# Patient Record
Sex: Female | Born: 1935 | Race: White | Hispanic: No | Marital: Married
Health system: Midwestern US, Academic
[De-identification: ages and names within clinical notes are randomized; demographics above are authoritative.]

---

## 2019-01-08 ENCOUNTER — Encounter: Admit: 2019-01-08 | Discharge: 2019-01-08

## 2019-01-08 ENCOUNTER — Ambulatory Visit: Admit: 2019-01-08 | Discharge: 2019-01-09

## 2019-01-08 ENCOUNTER — Ambulatory Visit: Admit: 2019-01-08 | Discharge: 2019-01-08

## 2019-01-08 DIAGNOSIS — R42 Dizziness and giddiness: Secondary | ICD-10-CM

## 2019-01-08 DIAGNOSIS — R0789 Other chest pain: Secondary | ICD-10-CM

## 2019-01-08 DIAGNOSIS — R0600 Dyspnea, unspecified: Secondary | ICD-10-CM

## 2019-01-13 ENCOUNTER — Encounter: Admit: 2019-01-13 | Discharge: 2019-01-13

## 2019-01-13 ENCOUNTER — Ambulatory Visit: Admit: 2019-01-13 | Discharge: 2019-01-14

## 2019-01-13 DIAGNOSIS — Z9189 Other specified personal risk factors, not elsewhere classified: Secondary | ICD-10-CM

## 2019-01-13 DIAGNOSIS — R079 Chest pain, unspecified: Secondary | ICD-10-CM

## 2019-01-13 DIAGNOSIS — I10 Essential (primary) hypertension: Secondary | ICD-10-CM

## 2019-01-13 DIAGNOSIS — R0602 Shortness of breath: Secondary | ICD-10-CM

## 2019-01-13 MED ORDER — LOSARTAN 25 MG PO TAB
25 mg | ORAL_TABLET | Freq: Two times a day (BID) | ORAL | 3 refills | 30.00000 days | Status: AC
Start: 2019-01-13 — End: ?

## 2019-01-13 NOTE — Progress Notes
Date of Service: 01/13/2019    Amber Keith is a 85 y.o. female.       HPI     Patient is an 85 year old white female that has been referred to our practice for chest pain evaluation.  Patient reports that in the past 2 weeks she has been experiencing some episodes of chest pain that were located under the left axillary line radiating towards the epigastric area and sometimes to the back.  They are not associated with physical activity.  At times, patient has been experiencing symptoms of shortness of breath with physical activity.    She reports having pneumonia and flu approximately 2 years ago and patient thinks that ever since a lot of the way she usually feels has changed.  She had permanent changes in her voice, and also noticed more fatigue.    Patient was evaluated with a 2D echo Doppler study on 01/08/2019, it demonstrated normal left ventricular systolic function, there were no significant valvular abnormalities and the pulmonary artery pressure was 25 mmHg.  A myocardial perfusion imaging study performed the same day did not demonstrate ischemia and the left ventricle systolic function was normal at 89/s.    Today in our office patient's blood pressure was elevated, with the recorded 142/96 mmHg in the left arm and 142/94 mmHg in the right arm with a heart rate of 76 bpm.  She is currently on losartan 25 mg p.o. daily.    Cholesterol profile dated 01/08/2019 demonstrated a total cholesterol of 251 mg/dL, calculated HDL 557 mg/dL, HDL 66 mg/dL, and triglycerides 322 mg/dL.  Patient is not currently on statin therapy.    Family history: Not significant due to patient's age    Social history: Patient retired on her last but they on September 18, 2018, she used to work for up [employer].  She does not smoke cigarettes and does not drink alcohol.         Vitals:    01/13/19 1058 01/13/19 1108   BP: (!) 142/96 (!) 142/94   BP Source: Arm, Left Upper Arm, Right Upper   Pulse: 76    SpO2: 97% Weight: 59.3 kg (130 lb 12.8 oz)    Height: 1.651 m (5' 5)    PainSc: Zero      Body mass index is 21.77 kg/m???.     Past Medical History  Patient Active Problem List    Diagnosis Date Noted   ??? Chest pain 01/13/2019     01/08/2019 : MPI  This study is normal and represents low probability of significant inducible myocardial ischemia.  There are no perfusion abnormalities identified on this study.  Regional and global left ventricular function are normal.  All myocardial segments appear viable.  High risk scintigraphic features are absent.       ??? Hypertension 01/13/2019     01/08/19  Echo:  EF 60%.  Normal right ventricular contractility.  Normal central venous pressure.  Mild mitral annular calcification.  Normal mitral valve motion.  Normal tricuspid valve motion.  Mild tricuspid regurgitation.  Est PA pressure 25 mmHg.  Pulomary artery, pulmonic valve, and aortic arch are not well visualized.  Mild pulmonic regurgitation.  Trace pericardial effusion.    06/07/2014 Echo:  There is hyperdynamic LV systolic function.  LV EF 74%.  Mild diastolic dysfunction.  There is reversal of the E to A ratio and/or prolonged deceleration time consistent with impaired LV relaxation.      ??? Shortness of breath 01/13/2019  05/25/2016 PFT: Normal spirometry.  FEV1 2.11 liters or 106% predicted. FEV1/FVC ratio at 76%.  No improvement postbronchodilator.  Normal lung volumes with a total lung cpacity of **% of predicted. Mildly diminished diffusion at 71% of predicted but corrects for lung volumes up to 94% of predicted.  Airway resistance was normal. Flow volume loop shows a normal inspiratory and expiratory limb and time volume curves are satisfactory.            Review of Systems   Constitution: Positive for malaise/fatigue.   HENT: Positive for hearing loss, hoarse voice and tinnitus.    Eyes: Negative.    Cardiovascular: Positive for chest pain, dyspnea on exertion and irregular heartbeat. Respiratory: Positive for shortness of breath.    Endocrine: Negative.    Hematologic/Lymphatic: Negative.    Skin: Negative.    Musculoskeletal: Positive for back pain and muscle weakness.   Gastrointestinal: Negative.    Genitourinary: Negative.    Neurological: Positive for light-headedness and weakness.   Psychiatric/Behavioral: Positive for memory loss.   Allergic/Immunologic: Negative.        Physical Exam  General Appearance: normal in appearance  Skin: warm, moist, no ulcers or xanthomas  Eyes: conjunctivae and lids normal, pupils are equal and round  Lips & Oral Mucosa: no pallor or cyanosis  Neck Veins: neck veins are flat, neck veins are not distended  Chest Inspection: chest is normal in appearance  Respiratory Effort: breathing comfortably, no respiratory distress  Auscultation/Percussion: lungs clear to auscultation, no rales or rhonchi, no wheezing  Cardiac Rhythm: regular rhythm and normal rate  Cardiac Auscultation: S1, S2 normal, no rub, no gallop  Murmurs: no murmur  Carotid Arteries: normal carotid upstroke bilaterally, no bruit  Lower Extremity Edema: no lower extremity edema  Abdominal Exam: soft, non-tender, no masses, bowel sounds normal  Liver & Spleen: no organomegaly  Language and Memory: patient responsive and seems to comprehend information  Neurologic Exam: neurological assessment grossly intact      Cardiovascular Studies  Twelve-lead EKG demonstrated normal sinus rhythm.    Problems Addressed Today  Encounter Diagnoses   Name Primary?   ??? Chest pain, unspecified type    ??? Hypertension, unspecified type    ??? Shortness of breath    ??? At risk for coronary artery disease        Assessment and Plan     In summary: This is an 85 year old female that has been experiencing chest pain, according to her report it appears to be mostly atypical, recent evaluation with an echocardiogram and a regadenoson MPI demonstrated normal left ventricular systolic function, the tomographic pattern was negative for ischemia.    Patient's blood pressure is suboptimally controlled, this could have a contribution to her symptoms of shortness of breath.  She also has hyperlipidemia that is currently not treated with statin therapy.    Plan:    1.  Increase losartan to 25 mg p.o. twice daily  2.  I did asked the patient to start taking aspirin 81 mg p.o. daily  3.  I would also like to recommend to initiate atorvastatin 20 mg p.o. daily, this was not prescribed by me today, it could be initiated by your office.  4.  Follow-up fasting lipid and liver profile in approximately 3 months.         Current Medications (including today's revisions)  ??? fish oil- omega 3-DHA/EPA 300/1,000 mg capsule Take 1 capsule by mouth daily.   ??? losartan (COZAAR) 25 mg tablet  Take 25 mg by mouth daily.   ??? vit C/E/Zn/coppr/lutein/zeaxan (PRESERVISION AREDS-2 PO) Take 1 tablet by mouth daily.

## 2019-01-20 ENCOUNTER — Encounter: Admit: 2019-01-20 | Discharge: 2019-01-20

## 2019-01-20 DIAGNOSIS — R079 Chest pain, unspecified: Secondary | ICD-10-CM

## 2019-01-20 DIAGNOSIS — Z9189 Other specified personal risk factors, not elsewhere classified: Secondary | ICD-10-CM

## 2019-01-20 DIAGNOSIS — E785 Hyperlipidemia, unspecified: Secondary | ICD-10-CM

## 2019-01-22 ENCOUNTER — Encounter: Admit: 2019-01-22 | Discharge: 2019-01-22

## 2019-01-22 MED ORDER — ATORVASTATIN 20 MG PO TAB
20 mg | ORAL_TABLET | Freq: Every day | ORAL | 5 refills | Status: DC
Start: 2019-01-22 — End: 2019-07-20

## 2019-01-22 NOTE — Telephone Encounter
Per transcription [facility] ordered atorvastatin 20 mg daily.  Called pt with recommendations.  Patient verbalizes understanding.  Rx sent to pharmacy.   Pt is to f/u PRN.

## 2019-04-07 ENCOUNTER — Encounter: Admit: 2019-04-07 | Discharge: 2019-04-07 | Payer: MEDICARE

## 2019-04-15 LAB — LIPID PROFILE
Lab: 14
Lab: 151
Lab: 2
Lab: 64
Lab: 70
Lab: 73

## 2019-04-15 LAB — LIVER FUNCTION PANEL
Lab: 0.2
Lab: 0.3

## 2019-04-17 ENCOUNTER — Encounter: Admit: 2019-04-17 | Discharge: 2019-04-17 | Payer: MEDICARE

## 2019-04-17 DIAGNOSIS — R079 Chest pain, unspecified: Secondary | ICD-10-CM

## 2019-04-17 DIAGNOSIS — E785 Hyperlipidemia, unspecified: Secondary | ICD-10-CM

## 2019-04-17 DIAGNOSIS — Z9189 Other specified personal risk factors, not elsewhere classified: Secondary | ICD-10-CM

## 2019-07-18 ENCOUNTER — Encounter: Admit: 2019-07-18 | Discharge: 2019-07-18 | Payer: MEDICARE

## 2019-07-20 ENCOUNTER — Encounter: Admit: 2019-07-20 | Discharge: 2019-07-20 | Payer: MEDICARE

## 2019-07-20 MED ORDER — ATORVASTATIN 20 MG PO TAB
ORAL_TABLET | Freq: Every day | ORAL | 3 refills | 90.00000 days | Status: AC
Start: 2019-07-20 — End: ?

## 2021-08-28 IMAGING — MG MAMMOGRAM 3D SCREEN, BILATERAL
12 of 17 series · 12 of 17 positions shown · non-contrast
Comparison: none

[R CC (1 of 2)]
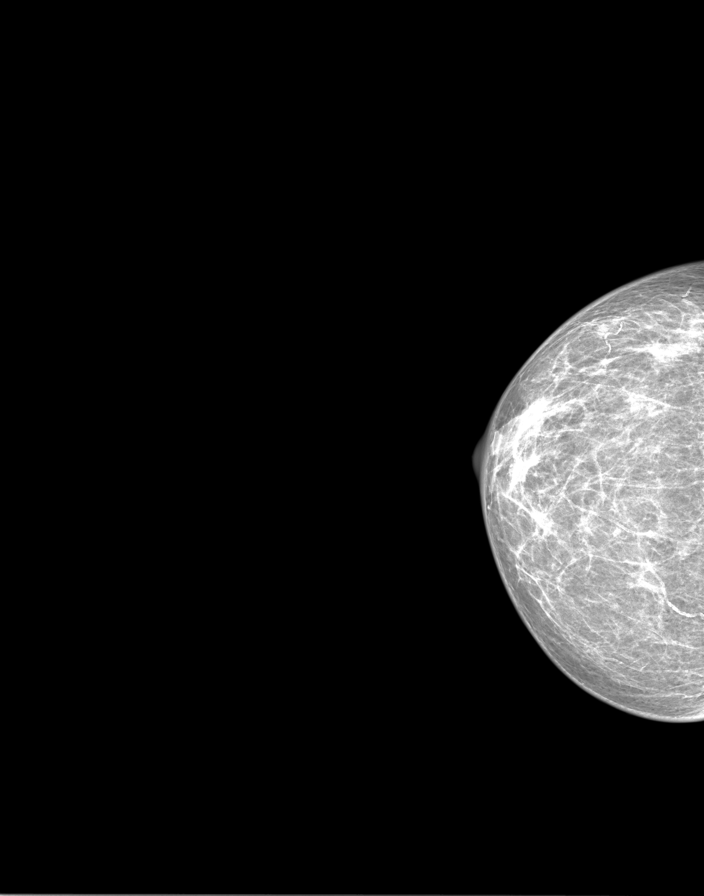

[R tomo (1 of 2)]
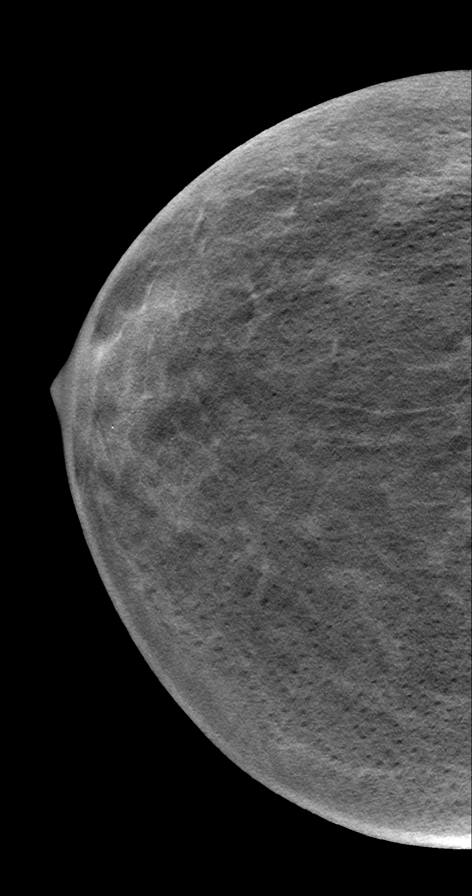

[R CC (2 of 2)]
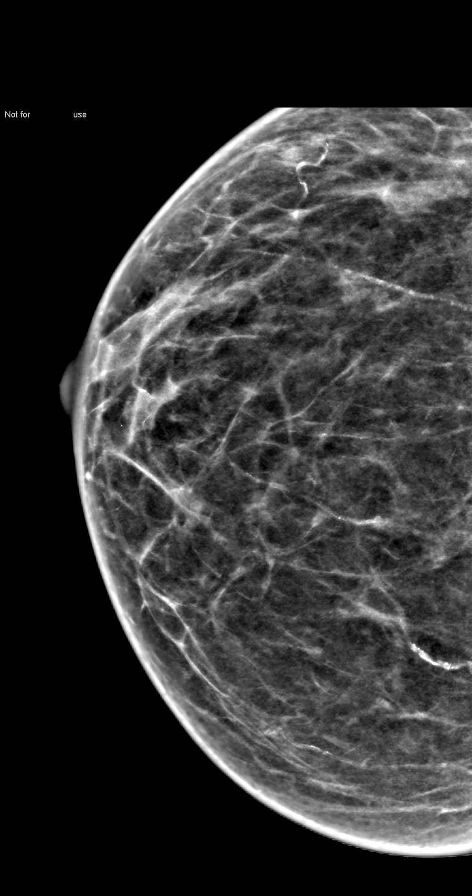

[R (1 of 2)]
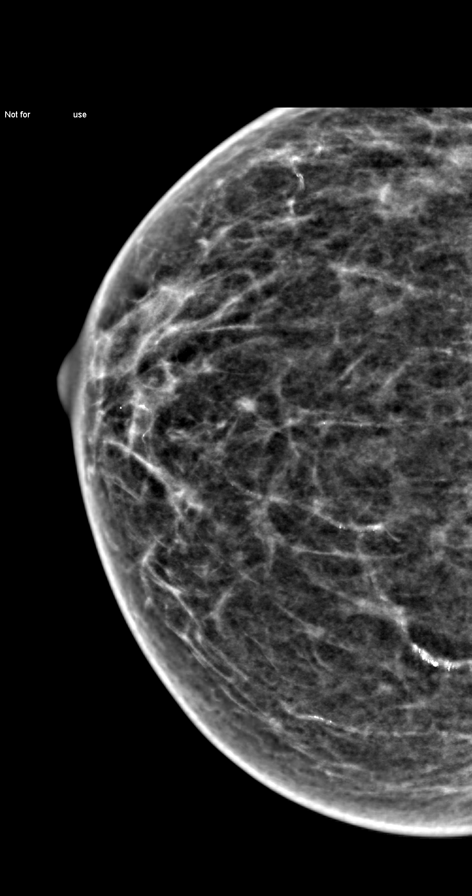

[L CC (1 of 2)]
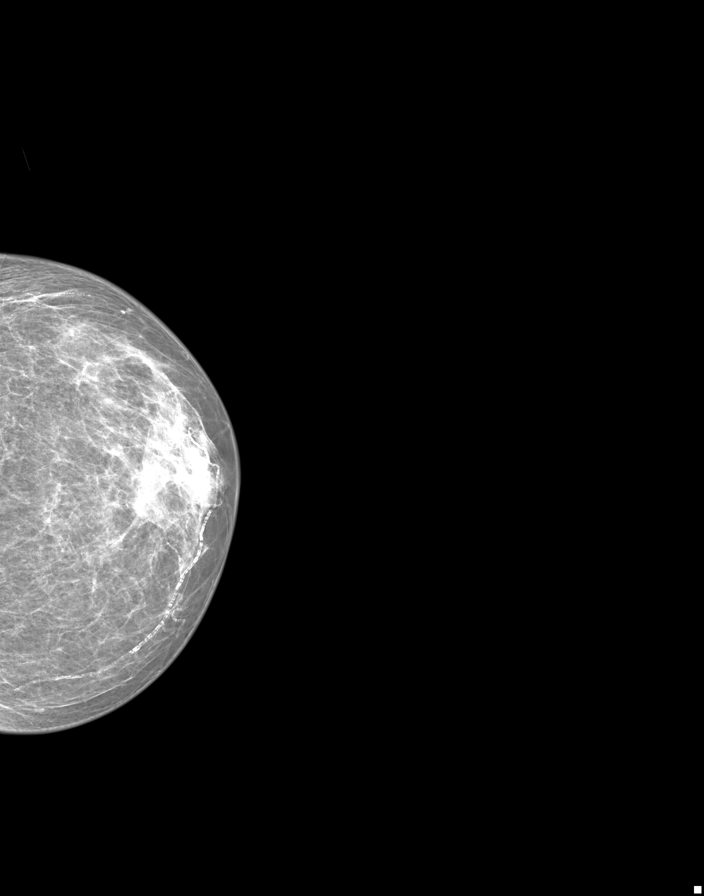

[L tomo]
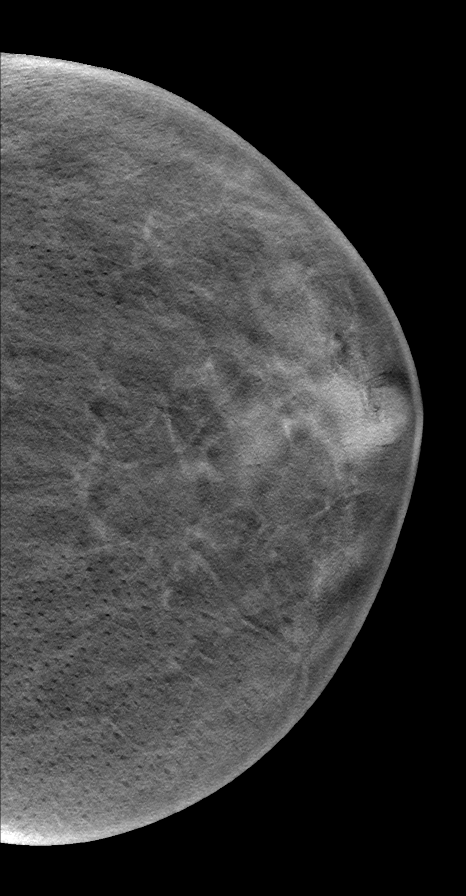

[L CC (2 of 2)]
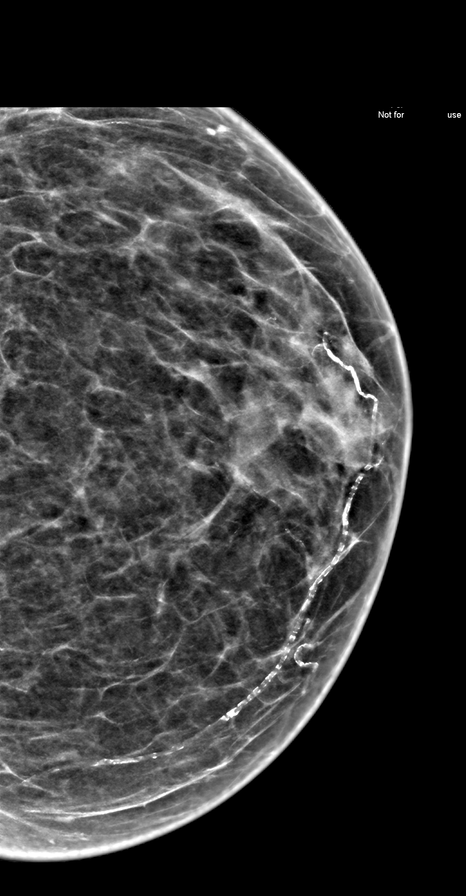

[L]
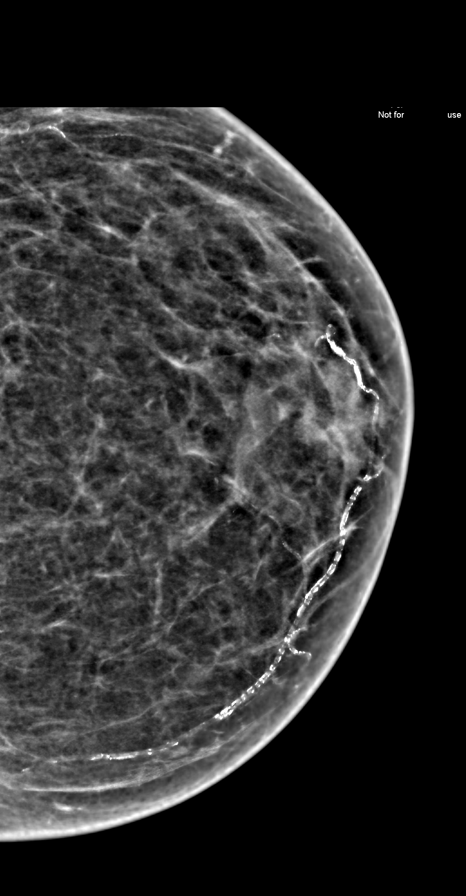

[R MLO (1 of 2)]
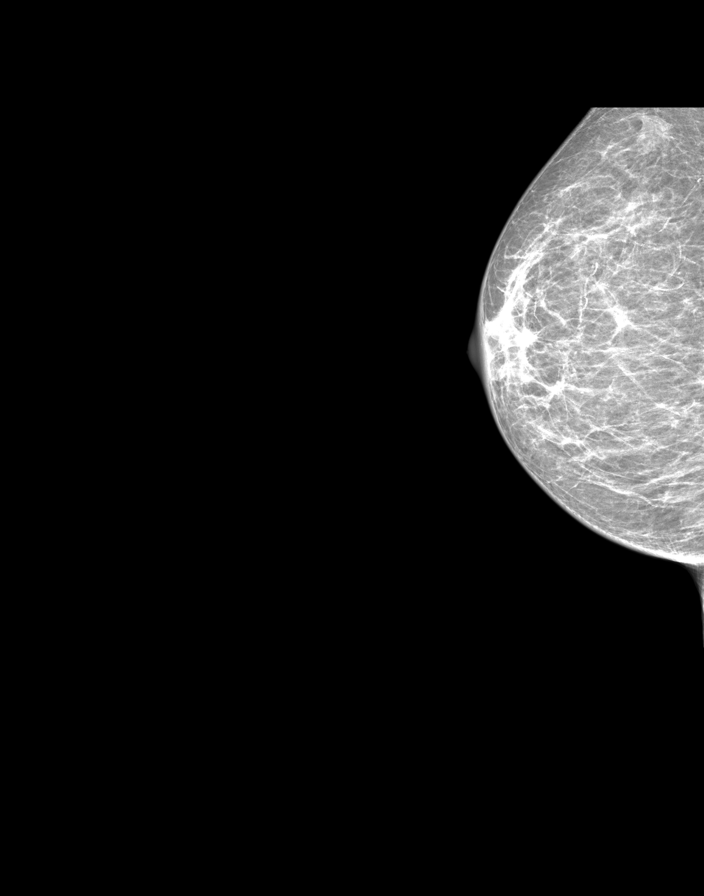

[R tomo (2 of 2)]
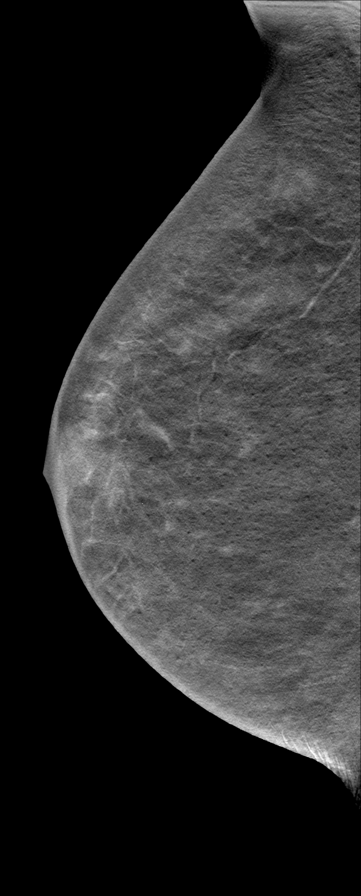

[R MLO (2 of 2)]
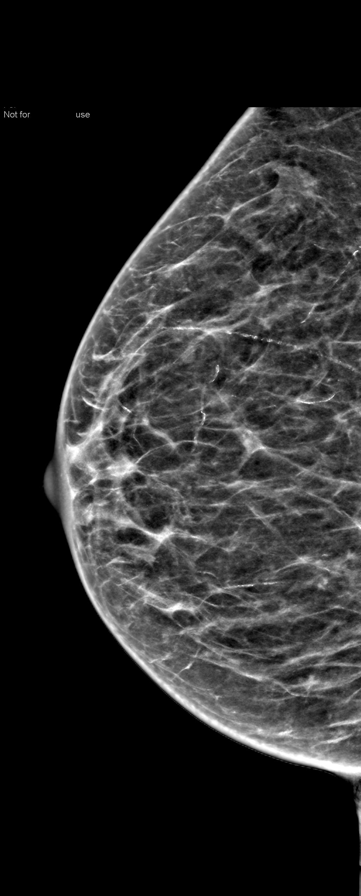

[R (2 of 2)]
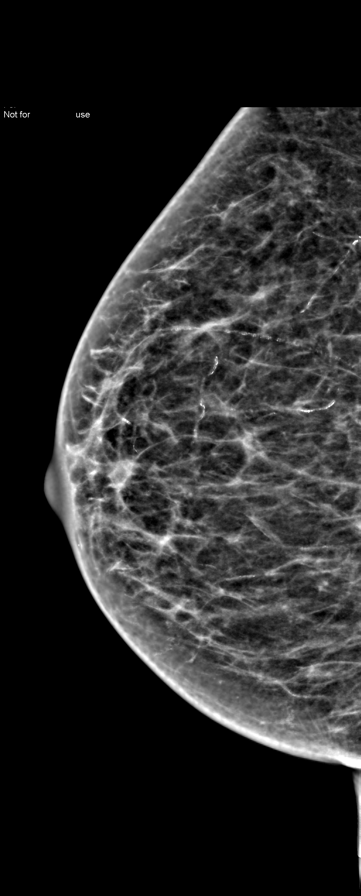

[12 of 17 positions shown; findings below may reference images not displayed]

EXAM

3D SCREENING MAMMOGRAM, BILATERAL

INDICATION

Screening for breast cancer
SCR 3D. LM

TECHNIQUE

Digital 2D CC and MLO projections obtained with 3D tomographic views per manufacturer's protocol.
ICAD version 7.2 was used during this exam.

COMPARISONS

December 16, 2017

FINDINGS

ACR Type 2:  25-50% There are scattered fibroglandular densities.

No concerning masses or calcifications are seen.

IMPRESSION

Stable mammograms. One year follow-up is recommended.

BI-RADS 2, BENIGN.

Tech Notes:
